# Patient Record
Sex: Male | Born: 1995 | Race: Black or African American | Hispanic: No | Marital: Single | Smoking: Never smoker
Health system: Southern US, Community
[De-identification: ages and names within clinical notes are randomized; demographics above are authoritative.]

## PROBLEM LIST (undated history)

## (undated) DIAGNOSIS — Q600 Renal agenesis, unilateral: Secondary | ICD-10-CM

## (undated) DIAGNOSIS — G43909 Migraine, unspecified, not intractable, without status migrainosus: Secondary | ICD-10-CM

## (undated) HISTORY — PX: EYE SURGERY: SHX253

## (undated) HISTORY — PX: HERNIA REPAIR: SHX51

---
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
## 2016-09-17 ENCOUNTER — Ambulatory Visit (INDEPENDENT_AMBULATORY_CARE_PROVIDER_SITE_OTHER): Payer: BLUE CROSS/BLUE SHIELD | Admitting: Urgent Care

## 2016-09-17 VITALS — BP 118/62 | HR 67 | Temp 98.2°F | Resp 16 | Ht 69.5 in | Wt 149.8 lb

## 2016-09-17 DIAGNOSIS — Z23 Encounter for immunization: Secondary | ICD-10-CM | POA: Diagnosis not present

## 2016-09-17 DIAGNOSIS — N6342 Unspecified lump in left breast, subareolar: Secondary | ICD-10-CM

## 2016-09-17 DIAGNOSIS — J4520 Mild intermittent asthma, uncomplicated: Secondary | ICD-10-CM

## 2016-09-17 MED ORDER — ALBUTEROL SULFATE HFA 108 (90 BASE) MCG/ACT IN AERS: 2.0000 | INHALATION_SPRAY | Freq: Four times a day (QID) | RESPIRATORY_TRACT | 5 refills | Status: AC | PRN

## 2016-09-17 MED ORDER — ALBUTEROL SULFATE (2.5 MG/3ML) 0.083% IN NEBU: 2.5000 mg | INHALATION_SOLUTION | RESPIRATORY_TRACT | 5 refills | Status: AC | PRN

## 2016-09-17 NOTE — Patient Instructions (Addendum)
  950am 09/20/16 [address] [place] [facility]   IF you received an x-ray today, you will receive an Economistinvoice from [facility]. Please contact [facility] at [phone] with questions or concerns regarding your invoice.   IF you received labwork today, you will receive an invoice from [facility]. Please contact [facility] at [phone] with questions or concerns regarding your invoice.   Our billing staff will not be able to assist you with questions regarding bills from these companies.  You will be contacted with the lab results as soon as they are available. The fastest way to get your results is to activate your My Chart account. Instructions are located on the last page of this paperwork. If you have not heard from us regarding the results in 2 weeks, please contact this office.

## 2016-09-17 NOTE — Progress Notes (Addendum)
  MRN: [id number] DOB: 09-26-1995  Subjective:   Douglas Dickson is a 21 y.o. male presenting for chief complaint of Cyst (LEFT SIDE OF LEFT BREAST x 2 days) and Medication Refill (albuterol inhaler and nebulizer solutions)  Cyst - Reports 2 day history of left sided masses near his nipple. Mass is tender with pressure. Has not tried any interventions. Denies fever, redness, drainage of pus or bleeding. Maternal grandmother has history of breast cancer twice.  Asthma - Managed with asthma inhaler and nebulizer. Weather changes flare up his asthma. Reports that he has had to use his albuterol inhaler 1-2 times every other week. Has used his nebulizer occasionally in the past 2 months. He would like a refill since the Spring season is about to start and typically flares up his asthma. Denies smoking cigarettes. Denies fever, weight loss, decreased appetite, rashes, n/v, abdominal pain, chest pain.   Jomarie LongsJoseph has a current medication list which includes the following prescription(s): acetaminophen, albuterol, albuterol, and fluticasone. Also is allergic to codeine.  Jomarie LongsJoseph  has a past medical history of Asthma; Congenital single kidney; and Migraines. Also  has a past surgical history that includes Hernia repair and Eye surgery. His family history includes Cancer in his maternal grandmother; Diabetes in his maternal grandmother and paternal grandfather; Heart disease in his father; Hypertension in his maternal grandmother.    Objective:   Vitals: BP 118/62 (BP Location: Right Arm, Patient Position: Sitting, Cuff Size: Normal)   Pulse 67   Temp 98.2 F (36.8 C) (Oral)   Resp 16   Ht 5' 9.5" (1.765 m)   Wt 149 lb 12.8 oz (67.9 kg)   SpO2 99%   BMI 21.80 kg/m   Physical Exam  Constitutional: He is oriented to person, place, and time. He appears well-developed and well-nourished.  HENT:  Mouth/Throat: Oropharynx is clear and moist.  Eyes: EOM are normal. Pupils are equal, round, and reactive  to light. No scleral icterus.  Neck: Normal range of motion. Neck supple. No thyromegaly present.  Cardiovascular: Normal rate, regular rhythm and intact distal pulses.  Exam reveals no gallop and no friction rub.   No murmur heard. Pulmonary/Chest: No respiratory distress. He has no wheezes. He has no rales.    Abdominal: Soft. Bowel sounds are normal. He exhibits no distension and no mass. There is no tenderness. There is no guarding.  Musculoskeletal: He exhibits no edema.  Lymphadenopathy:    He has no cervical adenopathy.  Neurological: He is alert and oriented to person, place, and time.  Skin: Skin is warm and dry. No rash noted.   Assessment and Plan :   1. Subareolar mass of left breast - Will obtain U/S for further evaluation.  2. Mild intermittent asthma without complication - Refilled albuterol inhaler and nebulizer.  3. Need for prophylactic vaccination and inoculation against influenza - Flu Vaccine QUAD 36+ mos IM   Wallis BambergMario Lavontay Kirk, PA-C [facility] [phone] 09/17/2016  3:28 PM

## 2016-09-18 LAB — CBC WITH DIFFERENTIAL/PLATELET
BASOS: 0 %
Basophils Absolute: 0 10*3/uL (ref 0.0–0.2)
EOS (ABSOLUTE): 0.1 10*3/uL (ref 0.0–0.4)
EOS: 1 %
HEMATOCRIT: 45 % (ref 37.5–51.0)
Hemoglobin: 15.2 g/dL (ref 13.0–17.7)
IMMATURE GRANS (ABS): 0 10*3/uL (ref 0.0–0.1)
IMMATURE GRANULOCYTES: 0 %
LYMPHS: 50 %
Lymphocytes Absolute: 2.6 10*3/uL (ref 0.7–3.1)
MCH: 29.6 pg (ref 26.6–33.0)
MCHC: 33.8 g/dL (ref 31.5–35.7)
MCV: 88 fL (ref 79–97)
Monocytes Absolute: 0.4 10*3/uL (ref 0.1–0.9)
Monocytes: 8 %
NEUTROS ABS: 2.2 10*3/uL (ref 1.4–7.0)
NEUTROS PCT: 41 %
Platelets: 244 10*3/uL (ref 150–379)
RBC: 5.13 x10E6/uL (ref 4.14–5.80)
RDW: 14.1 % (ref 12.3–15.4)
WBC: 5.2 10*3/uL (ref 3.4–10.8)

## 2016-09-20 ENCOUNTER — Ambulatory Visit
Admission: RE | Admit: 2016-09-20 | Discharge: 2016-09-20 | Disposition: A | Discharge status: Home / Self Care | Payer: BLUE CROSS/BLUE SHIELD | Source: Ambulatory Visit | Attending: Urgent Care | Admitting: Urgent Care

## 2017-09-19 ENCOUNTER — Emergency Department (HOSPITAL_BASED_OUTPATIENT_CLINIC_OR_DEPARTMENT_OTHER)
Admission: EM | Admit: 2017-09-19 | Discharge: 2017-09-19 | Disposition: A | Discharge status: Home / Self Care | Payer: BLUE CROSS/BLUE SHIELD | Attending: Physician Assistant | Admitting: Physician Assistant

## 2017-09-19 ENCOUNTER — Encounter (HOSPITAL_BASED_OUTPATIENT_CLINIC_OR_DEPARTMENT_OTHER): Payer: Self-pay | Admitting: *Deleted

## 2017-09-19 ENCOUNTER — Emergency Department (HOSPITAL_BASED_OUTPATIENT_CLINIC_OR_DEPARTMENT_OTHER): Payer: BLUE CROSS/BLUE SHIELD

## 2017-09-19 ENCOUNTER — Other Ambulatory Visit: Payer: Self-pay

## 2017-09-19 DIAGNOSIS — J111 Influenza due to unidentified influenza virus with other respiratory manifestations: Secondary | ICD-10-CM | POA: Insufficient documentation

## 2017-09-19 DIAGNOSIS — H6123 Impacted cerumen, bilateral: Secondary | ICD-10-CM | POA: Diagnosis not present

## 2017-09-19 DIAGNOSIS — R509 Fever, unspecified: Secondary | ICD-10-CM | POA: Diagnosis present

## 2017-09-19 DIAGNOSIS — J45909 Unspecified asthma, uncomplicated: Secondary | ICD-10-CM | POA: Diagnosis not present

## 2017-09-19 DIAGNOSIS — R69 Illness, unspecified: Secondary | ICD-10-CM

## 2017-09-19 MED ORDER — BENZONATATE 100 MG PO CAPS: 100.0000 mg | ORAL_CAPSULE | Freq: Three times a day (TID) | ORAL | 0 refills | Status: DC | PRN

## 2017-09-19 MED ORDER — ACETAMINOPHEN 325 MG PO TABS
650.0000 mg | ORAL_TABLET | Freq: Once | ORAL | Status: AC
  Administered 2017-09-19: 650 mg via ORAL
  Filled 2017-09-19: qty 2

## 2017-09-19 MED FILL — BENZONATATE 100 MG CAPSULE: 100 | 7 days supply | Qty: 21 | Fill #0

## 2017-09-19 NOTE — Discharge Instructions (Signed)
Please take Tylenol (acetaminophen) to relieve your pain.  You may take tylenol, up to 1,000 mg (two extra strength pills).  Do not take more than 4,000 mg tylenol in a 24 hour period.  Please check all medication labels as many medications such as pain and cold medications may contain tylenol. Please do not drink alcohol while taking this medication.   Please make sure you are staying well hydrated.

## 2017-09-19 NOTE — ED Triage Notes (Signed)
Cough, headache and chills x 2 days.

## 2017-09-19 NOTE — ED Provider Notes (Signed)
[facility] EMERGENCY DEPARTMENT Provider Note   CSN: [id number] Arrival date & time: 09/19/17  1138     History   Chief Complaint Chief Complaint  Patient presents with  . Cough    HPI Douglas Dickson is a 22 y.o. male with a history of asthma, congenital single kidney, migraines, who presents today for evaluation of fevers, chills, generally not feeling well, and cough since Saturday.  He reports that at home he has been taking Benadryl and Mucinex without significant relief.  No nausea vomiting diarrhea.  He does report occasional headache.  He did not get a flu shot this year.  He reports no known sick contacts.    HPI  Past Medical History:  Diagnosis Date  . Asthma   . Congenital single kidney   . Migraines     There are no active problems to display for this patient.   Past Surgical History:  Procedure Laterality Date  . EYE SURGERY    . HERNIA REPAIR         Home Medications    Prior to Admission medications   Medication Sig Start Date End Date Taking? Authorizing Provider  acetaminophen (TYLENOL) 500 MG tablet Take 1,000 mg by mouth every 6 (six) hours as needed for moderate pain or fever.   Yes [provider]  albuterol (PROVENTIL HFA;VENTOLIN HFA) 108 (90 Base) MCG/ACT inhaler Inhale 2 puffs into the lungs every 6 (six) hours as needed for shortness of breath. For shortness of breath 09/17/16  Yes Wallis Bamberg, PA-C  albuterol (PROVENTIL) (2.5 MG/3ML) 0.083% nebulizer solution Take 3 mLs (2.5 mg total) by nebulization every 4 (four) hours as needed (for asthma). For asthma 09/17/16  Yes Wallis Bamberg, PA-C  fluticasone [facility]) 50 MCG/ACT nasal spray Place 2 sprays into the nose daily as needed for allergies or rhinitis.    Yes [provider]  benzonatate (TESSALON) 100 MG capsule Take 1 capsule (100 mg total) by mouth every 8 (eight) hours as needed for cough. 09/19/17   Cristina Gong, PA-C    Family History Family  History  Problem Relation Age of Onset  . Heart disease Father        HEART ATTACK  . Cancer Maternal Grandmother   . Diabetes Maternal Grandmother   . Hypertension Maternal Grandmother   . Diabetes Paternal Grandfather     Social History Social History   Tobacco Use  . Smoking status: Never Smoker  . Smokeless tobacco: Never Used  Substance Use Topics  . Alcohol use: No  . Drug use: No     Allergies   Codeine   Review of Systems Review of Systems  Constitutional: Positive for chills and fever (Subjective).  HENT: Positive for congestion and sore throat. Negative for postnasal drip.   Eyes: Negative for visual disturbance.  Respiratory: Positive for cough. Negative for chest tightness and shortness of breath.   Cardiovascular: Negative for chest pain.  Gastrointestinal: Negative for abdominal pain, diarrhea, nausea and vomiting.  Musculoskeletal: Positive for arthralgias and myalgias.  Neurological: Positive for headaches. Negative for dizziness.  All other systems reviewed and are negative.    Physical Exam Updated Vital Signs BP 134/66   Pulse 96   Temp (!) 100.4 F (38 C) (Oral)   Resp 20   Ht 5\' 11"  (1.803 m)   Wt 70.3 kg (155 lb)   SpO2 100%   BMI 21.62 kg/m   Physical Exam  Constitutional: He appears well-developed and well-nourished.  No distress.  HENT:  Head: Normocephalic and atraumatic.  Right Ear: Tympanic membrane, external ear and ear canal normal.  Left Ear: Tympanic membrane, external ear and ear canal normal.  Nose: Mucosal edema and rhinorrhea present.  Mouth/Throat: Uvula is midline, oropharynx is clear and moist and mucous membranes are normal. No oropharyngeal exudate. No tonsillar exudate.  After cerumen removal bilateral TMs are normal.  Eyes: Conjunctivae are normal. No scleral icterus.  Neck: Normal range of motion. Neck supple.  Cardiovascular: Normal rate and regular rhythm.  Pulmonary/Chest: Effort normal and breath sounds  normal. No stridor. No respiratory distress. He has no wheezes.  Lymphadenopathy:    He has no cervical adenopathy.  Neurological: He is alert.  Skin: Skin is warm and dry. He is not diaphoretic.  Psychiatric: He has a normal mood and affect. His behavior is normal.  Nursing note and vitals reviewed.    ED Treatments / Results  Labs (all labs ordered are listed, but only abnormal results are displayed) Labs Reviewed - No data to display  EKG  EKG Interpretation None       Radiology Dg Chest 2 View  Result Date: 09/19/2017 CLINICAL DATA:  Cough, sneezing spells off and on for several weeks more constant over past week, history asthma EXAM: CHEST  2 VIEW COMPARISON:  10/17/2013 FINDINGS: Normal heart size, mediastinal contours, and pulmonary vascularity. Lungs clear. No pleural effusion or pneumothorax. Bones unremarkable. IMPRESSION: Normal exam. Electronically Signed   By: Ulyses SouthwardMark  Boles M.D.   On: 09/19/2017 13:22    Procedures .Ear Cerumen Removal Date/Time: 09/19/2017 6:02 PM Performed by: Cristina GongHammond, Hedaya Latendresse W, PA-C Authorized by: Cristina GongHammond, Anila Bojarski W, PA-C   Consent:    Consent obtained:  Verbal   Consent given by:  Patient   Risks discussed:  Bleeding, dizziness, infection, incomplete removal, pain and TM perforation (Loss of hearing, scratches)   Alternatives discussed:  No treatment and alternative treatment Procedure details:    Location:  L ear and R ear   Procedure type: curette   Post-procedure details:    Inspection:  TM intact   Hearing quality:  Improved   Patient tolerance of procedure:  Tolerated well, no immediate complications   (including critical care time)  Medications Ordered in ED Medications  acetaminophen (TYLENOL) tablet 650 mg (650 mg Oral Given 09/19/17 1308)     Initial Impression / Assessment and Plan / ED Course  I have reviewed the triage vital signs and the nursing notes.  Pertinent labs & imaging results that were available during  my care of the patient were reviewed by me and considered in my medical decision making (see chart for details).    Patient with symptoms consistent with influenza.  Vitals are stable, low-grade fever.  No signs of dehydration, tolerating PO's.  Lungs are clear.  Based on patient's history of asthma, he requested chest x-ray which was obtained, unremarkable.  Discussed the cost versus benefit of Tamiflu treatment with the patient.  The patient understands that symptoms are greater than the recommended 24-48 hour window of treatment.  Patient will be discharged with instructions to orally hydrate, rest, and use over-the-counter medications such as anti-inflammatories ibuprofen and Aleve for muscle aches and Tylenol for fever.  Patient will also be given a cough suppressant.    Final Clinical Impressions(s) / ED Diagnoses   Final diagnoses:  Influenza-like illness    ED Discharge Orders        Ordered    benzonatate (TESSALON) 100 MG  capsule  Every 8 hours PRN     09/19/17 1351       Norman Clay 09/19/17 1803    Abelino Derrick, MD 09/23/17 2006

## 2018-03-25 ENCOUNTER — Other Ambulatory Visit: Payer: Self-pay

## 2018-03-25 ENCOUNTER — Encounter (HOSPITAL_COMMUNITY): Payer: Self-pay | Admitting: Emergency Medicine

## 2018-03-25 ENCOUNTER — Emergency Department (HOSPITAL_COMMUNITY)
Admission: EM | Admit: 2018-03-25 | Discharge: 2018-03-25 | Disposition: A | Discharge status: Home / Self Care | Payer: BLUE CROSS/BLUE SHIELD | Attending: Emergency Medicine | Admitting: Emergency Medicine

## 2018-03-25 DIAGNOSIS — G43909 Migraine, unspecified, not intractable, without status migrainosus: Secondary | ICD-10-CM | POA: Insufficient documentation

## 2018-03-25 DIAGNOSIS — J45909 Unspecified asthma, uncomplicated: Secondary | ICD-10-CM | POA: Insufficient documentation

## 2018-03-25 DIAGNOSIS — Z79899 Other long term (current) drug therapy: Secondary | ICD-10-CM | POA: Insufficient documentation

## 2018-03-25 DIAGNOSIS — G43109 Migraine with aura, not intractable, without status migrainosus: Secondary | ICD-10-CM

## 2018-03-25 MED ORDER — DEXAMETHASONE 4 MG PO TABS
10.0000 mg | ORAL_TABLET | Freq: Once | ORAL | Status: AC
  Administered 2018-03-25: 10 mg via ORAL
  Filled 2018-03-25: qty 3

## 2018-03-25 MED ORDER — PROCHLORPERAZINE MALEATE 5 MG PO TABS
10.0000 mg | ORAL_TABLET | Freq: Once | ORAL | Status: AC
  Administered 2018-03-25: 10 mg via ORAL
  Filled 2018-03-25: qty 2

## 2018-03-25 MED ORDER — DIPHENHYDRAMINE HCL 25 MG PO CAPS
25.0000 mg | ORAL_CAPSULE | Freq: Once | ORAL | Status: AC
  Administered 2018-03-25: 25 mg via ORAL
  Filled 2018-03-25: qty 1

## 2018-03-25 NOTE — ED Provider Notes (Signed)
[facility] EMERGENCY DEPARTMENT Provider Note   CSN: [id number] Arrival date & time: 03/25/18  1630     History   Chief Complaint Chief Complaint  Patient presents with  . Migraine    HPI Douglas Dickson is a 22 y.o. male.  The history is provided by the patient.  Migraine  This is a chronic problem. The current episode started 1 to 2 hours ago. The problem occurs every several days. The problem has been gradually improving. Associated symptoms include headaches. Pertinent negatives include no chest pain, no abdominal pain and no shortness of breath. Exacerbated by: light. The symptoms are relieved by acetaminophen. He has tried acetaminophen for the symptoms. The treatment provided moderate relief.    Past Medical History:  Diagnosis Date  . Asthma   . Congenital single kidney   . Migraines     There are no active problems to display for this patient.   Past Surgical History:  Procedure Laterality Date  . EYE SURGERY    . HERNIA REPAIR          Home Medications    Prior to Admission medications   Medication Sig Start Date End Date Taking? Authorizing Provider  acetaminophen (TYLENOL) 500 MG tablet Take 1,000 mg by mouth every 6 (six) hours as needed for moderate pain or fever.    [provider]  albuterol (PROVENTIL HFA;VENTOLIN HFA) 108 (90 Base) MCG/ACT inhaler Inhale 2 puffs into the lungs every 6 (six) hours as needed for shortness of breath. For shortness of breath 09/17/16   Wallis Bamberg, PA-C  albuterol (PROVENTIL) (2.5 MG/3ML) 0.083% nebulizer solution Take 3 mLs (2.5 mg total) by nebulization every 4 (four) hours as needed (for asthma). For asthma 09/17/16   Wallis Bamberg, PA-C  benzonatate (TESSALON) 100 MG capsule Take 1 capsule (100 mg total) by mouth every 8 (eight) hours as needed for cough. 09/19/17   Cristina Gong, PA-C  fluticasone [facility]) 50 MCG/ACT nasal spray Place 2 sprays into the nose daily as needed for  allergies or rhinitis.     [provider]    Family History Family History  Problem Relation Age of Onset  . Heart disease Father        HEART ATTACK  . Cancer Maternal Grandmother   . Diabetes Maternal Grandmother   . Hypertension Maternal Grandmother   . Diabetes Paternal Grandfather     Social History Social History   Tobacco Use  . Smoking status: Never Smoker  . Smokeless tobacco: Never Used  Substance Use Topics  . Alcohol use: No  . Drug use: No     Allergies   Codeine   Review of Systems Review of Systems  Constitutional: Negative for chills and fever.  HENT: Negative for ear pain and sore throat.   Eyes: Positive for photophobia and visual disturbance. Negative for pain.  Respiratory: Negative for cough and shortness of breath.   Cardiovascular: Negative for chest pain and palpitations.  Gastrointestinal: Negative for abdominal pain and vomiting.  Genitourinary: Negative for dysuria and hematuria.  Musculoskeletal: Negative for arthralgias and back pain.  Skin: Negative for color change and rash.  Neurological: Positive for headaches. Negative for dizziness, tremors, seizures, syncope and weakness.  All other systems reviewed and are negative.    Physical Exam Updated Vital Signs  ED Triage Vitals  Enc Vitals Group     BP 03/25/18 1646 (!) 147/89     Pulse Rate 03/25/18 1646 70  Resp 03/25/18 1646 18     Temp 03/25/18 1646 98.2 F (36.8 C)     Temp Source 03/25/18 1646 Oral     SpO2 03/25/18 1646 100 %     Weight --      Height --      Head Circumference --      Peak Flow --      Pain Score 03/25/18 1651 2     Pain Loc --      Pain Edu? --      Excl. in GC? --     Physical Exam  Constitutional: He is oriented to person, place, and time. He appears well-developed and well-nourished.  HENT:  Head: Normocephalic and atraumatic.  Eyes: Pupils are equal, round, and reactive to light. Conjunctivae and EOM are normal.  Neck:  Normal range of motion. Neck supple.  Cardiovascular: Normal rate and regular rhythm.  No murmur heard. Pulmonary/Chest: Effort normal and breath sounds normal. No respiratory distress.  Abdominal: Soft. There is no tenderness.  Musculoskeletal: Normal range of motion. He exhibits no edema.  Neurological: He is alert and oriented to person, place, and time. No cranial nerve deficit. He exhibits normal muscle tone. Coordination normal.  5+/5 strength, normal sensation, normal finger to nose finger, no drift, normal gait  Skin: Skin is warm and dry.  Psychiatric: He has a normal mood and affect.  Nursing note and vitals reviewed.    ED Treatments / Results  Labs (all labs ordered are listed, but only abnormal results are displayed) Labs Reviewed - No data to display  EKG None  Radiology No results found.  Procedures Procedures (including critical care time)  Medications Ordered in ED Medications  dexamethasone (DECADRON) tablet 10 mg (has no administration in time range)  diphenhydrAMINE (BENADRYL) capsule 25 mg (has no administration in time range)  prochlorperazine (COMPAZINE) tablet 10 mg (has no administration in time range)     Initial Impression / Assessment and Plan / ED Course  I have reviewed the triage vital signs and the nursing notes.  Pertinent labs & imaging results that were available during my care of the patient were reviewed by me and considered in my medical decision making (see chart for details).     Douglas Dickson is a 22 year old male with history of migraines who presents to the ED with headaches.  Patient with normal vitals.  No fever.  Patient has had headaches on and off for the last several weeks.  Has a history of headache since he was a child.  Family history of headaches as well.  Patient states that he gets the aura of paresthesias in his hands, photosensitivity and states that headaches usually improved with Motrin and Tylenol.  Headaches  seem to have increased in frequency over the last several weeks.  He has never seen a neurologist for his headaches.  Patient with no neurological symptoms at this time.  States his headache is about a 2 out of 10.  Patient has no signs to suggest infectious process at this time such as meningitis.  Patient is overall well-appearing.  Has a normal neurological exam.  History and physical is consistent with migraine headaches.  He was given Benadryl, Compazine, Decadron p.o. for further headache relief.  Recommend close follow-up with primary care doctor and believe patient would benefit from neurologist work-up as he would likely be a candidate for preventative headache medication given his history.  Patient was discharged from ED in good condition and  told to return to ED if symptoms worsen.  Final Clinical Impressions(s) / ED Diagnoses   Final diagnoses:  Migraine with aura and without status migrainosus, not intractable    ED Discharge Orders    None       Virgina NorfolkCuratolo, Mardi Cannady, DO 03/25/18 1942

## 2018-03-25 NOTE — ED Triage Notes (Signed)
Pt c/o migraines x 1 mos. Hx of migraines, states they have been coming more frequently and severity. Pt c/o severe headaches, nausea, left hand numbness and photosensitivity. States symptoms have mostly resolved at this time. r

## 2023-09-09 ENCOUNTER — Encounter (HOSPITAL_BASED_OUTPATIENT_CLINIC_OR_DEPARTMENT_OTHER): Payer: Self-pay | Admitting: Emergency Medicine

## 2023-09-09 ENCOUNTER — Emergency Department (HOSPITAL_BASED_OUTPATIENT_CLINIC_OR_DEPARTMENT_OTHER)
Admission: EM | Admit: 2023-09-09 | Discharge: 2023-09-09 | Disposition: A | Discharge status: Home / Self Care | Payer: 59

## 2023-09-09 ENCOUNTER — Other Ambulatory Visit: Payer: Self-pay

## 2023-09-09 DIAGNOSIS — M79674 Pain in right toe(s): Secondary | ICD-10-CM | POA: Diagnosis present

## 2023-09-09 DIAGNOSIS — L03031 Cellulitis of right toe: Secondary | ICD-10-CM | POA: Insufficient documentation

## 2023-09-09 DIAGNOSIS — L03032 Cellulitis of left toe: Secondary | ICD-10-CM

## 2023-09-09 MED ORDER — AMOXICILLIN-POT CLAVULANATE 875-125 MG PO TABS: 1.0000 | ORAL_TABLET | Freq: Two times a day (BID) | ORAL | 0 refills | Status: AC

## 2023-09-09 NOTE — ED Provider Notes (Signed)
[place] EMERGENCY DEPARTMENT AT [facility] Provider Note   CSN: [id number] Arrival date & time: 09/09/23  8416     History  Chief Complaint  Patient presents with   Toe Pain    Douglas Dickson is a 28 y.o. male.  28 year old with right toe pain x 1-1/2 days.  Symptoms started after trimming nails.  Painful.  No fevers chills.  Not draining pus.   Toe Pain       Home Medications Prior to Admission medications   Medication Sig Start Date End Date Taking? Authorizing Provider  acetaminophen (TYLENOL) 500 MG tablet Take 1,000 mg by mouth every 6 (six) hours as needed for moderate pain or fever.    [provider]  albuterol (PROVENTIL HFA;VENTOLIN HFA) 108 (90 Base) MCG/ACT inhaler Inhale 2 puffs into the lungs every 6 (six) hours as needed for shortness of breath. For shortness of breath 09/17/16   Wallis Bamberg, PA-C  albuterol (PROVENTIL) (2.5 MG/3ML) 0.083% nebulizer solution Take 3 mLs (2.5 mg total) by nebulization every 4 (four) hours as needed (for asthma). For asthma 09/17/16   Wallis Bamberg, PA-C  benzonatate (TESSALON) 100 MG capsule Take 1 capsule (100 mg total) by mouth every 8 (eight) hours as needed for cough. 09/19/17   Cristina Gong, PA-C  fluticasone [facility]) 50 MCG/ACT nasal spray Place 2 sprays into the nose daily as needed for allergies or rhinitis.     [provider]      Allergies    Codeine    Review of Systems   Review of Systems  Physical Exam Updated Vital Signs BP (!) 143/99 (BP Location: Left Arm)   Pulse 72   Temp 98.7 F (37.1 C) (Tympanic)   Resp 16   Ht 5\' 11"  (1.803 m)   Wt 71.2 kg   SpO2 100%   BMI 21.90 kg/m  Physical Exam Vitals reviewed.  HENT:     Head: Normocephalic.  Cardiovascular:     Rate and Rhythm: Normal rate.     Pulses: Normal pulses.  Pulmonary:     Effort: Pulmonary effort is normal.  Abdominal:     General: Abdomen is flat.  Musculoskeletal:     Comments: Great toe  with paronychia.  Skin:    General: Skin is warm.     Capillary Refill: Capillary refill takes less than 2 seconds.  Neurological:     Mental Status: He is alert and oriented to person, place, and time.  Psychiatric:        Mood and Affect: Mood normal.        Behavior: Behavior normal.     ED Results / Procedures / Treatments   Labs (all labs ordered are listed, but only abnormal results are displayed) Labs Reviewed - No data to display  EKG None  Radiology No results found.  Procedures Procedures    Medications Ordered in ED Medications - No data to display  ED Course/ Medical Decision Making/ A&P                                 Medical Decision Making 28 year old male present emergency department with toe pain.  Is afebrile vital signs reassuring.  Exam with paronychia.  Does not appear to have fluctuance to necessitate incision and drainage.  Does have some superficial cellulitis extending to the first joint.  Will discharge with some antibiotics.  Also instructed to soak  in iodine water.  Follow-up with PCP.  Amount and/or Complexity of Data Reviewed Labs:     Details: Isolated localized infection, low suspicion for systemic infection.  Will forego labs as I do not believe patient with systemic illness/sepsis. Radiology:     Details: Considered x-ray, however exam consistent with paronychia with superficial cellulitis.  Low suspicion for osteomyelitis or deep space infection.  Risk Decision regarding hospitalization. Risk Details: Reassuring vitals and exam.  Low suspicion for systemic infection.          Final Clinical Impression(s) / ED Diagnoses Final diagnoses:  None    Rx / DC Orders ED Discharge Orders     None         Coral Spikes, DO 09/09/23 [id number]

## 2023-09-09 NOTE — ED Notes (Signed)
Reviewed discharge instructions, follow up and medications with pt. Pt states understanding. Pt given bottle of iodine to take home. Pain free at this time

## 2023-09-09 NOTE — Discharge Instructions (Addendum)
Please soak your foot in warm water with 3 to 4 drops of iodine for 15 to 20 minutes 2-3 times a day.  Also please take antibiotics prescribed to you.  Please follow-up with your primary doctor.  Return if develop fevers, chills, redness spreads, toe becomes black, blue or discolored or he develop any new or worsening symptoms that are concerning to you.

## 2023-09-09 NOTE — ED Triage Notes (Signed)
Pt states cut toe nail to short got red and swollen, now is yellowish. 1st toe on right foot about 2 days ago.

## 2023-11-07 ENCOUNTER — Other Ambulatory Visit: Payer: Self-pay

## 2023-11-07 ENCOUNTER — Emergency Department (HOSPITAL_BASED_OUTPATIENT_CLINIC_OR_DEPARTMENT_OTHER)
Admission: EM | Admit: 2023-11-07 | Discharge: 2023-11-07 | Disposition: A | Discharge status: Home / Self Care | Attending: Emergency Medicine | Admitting: Emergency Medicine

## 2023-11-07 DIAGNOSIS — M791 Myalgia, unspecified site: Secondary | ICD-10-CM | POA: Insufficient documentation

## 2023-11-07 DIAGNOSIS — J069 Acute upper respiratory infection, unspecified: Secondary | ICD-10-CM | POA: Insufficient documentation

## 2023-11-07 DIAGNOSIS — R112 Nausea with vomiting, unspecified: Secondary | ICD-10-CM | POA: Diagnosis not present

## 2023-11-07 DIAGNOSIS — R197 Diarrhea, unspecified: Secondary | ICD-10-CM | POA: Insufficient documentation

## 2023-11-07 DIAGNOSIS — R059 Cough, unspecified: Secondary | ICD-10-CM | POA: Diagnosis present

## 2023-11-07 LAB — RESP PANEL BY RT-PCR (RSV, FLU A&B, COVID)  RVPGX2
Influenza A by PCR: NEGATIVE
Influenza B by PCR: NEGATIVE
Resp Syncytial Virus by PCR: NEGATIVE
SARS Coronavirus 2 by RT PCR: NEGATIVE

## 2023-11-07 MED ORDER — ONDANSETRON 4 MG PO TBDP: ORAL_TABLET | ORAL | 0 refills | Status: AC

## 2023-11-07 MED ORDER — BENZONATATE 100 MG PO CAPS: 100.0000 mg | ORAL_CAPSULE | Freq: Three times a day (TID) | ORAL | 0 refills | Status: AC

## 2023-11-07 NOTE — Discharge Instructions (Signed)
 Take tylenol 2 pills 4 times a day and motrin 4 pills 3 times a day.  Drink plenty of fluids.  Return for worsening shortness of breath, headache, confusion. Follow up with your family doctor.

## 2023-11-07 NOTE — ED Provider Notes (Signed)
 [place] EMERGENCY DEPARTMENT AT [facility] Provider Note   CSN: [id number] Arrival date & time: 11/07/23  7829     History  Chief Complaint  Patient presents with   URI    Douglas Dickson is a 28 y.o. male.  28 yo M with a chief complaints of cough congestion fevers chills myalgias nausea vomiting and diarrhea.  Been going on for about a week.  Started with just cough and congestion he thought maybe he had had worsening allergies.  Symptoms got worse and then actually have gotten a bit better over the past day or so.  He is most concerned with the fact that he was having fevers and chills off and on.  Came in for evaluation.  Does have a history of asthma but does not feel that its much worse than baseline.   URI      Home Medications Prior to Admission medications   Medication Sig Start Date End Date Taking? Authorizing Provider  benzonatate (TESSALON) 100 MG capsule Take 1 capsule (100 mg total) by mouth every 8 (eight) hours. 11/07/23  Yes Melene Plan, DO  ondansetron (ZOFRAN-ODT) 4 MG disintegrating tablet 4mg  ODT q4 hours prn nausea/vomit 11/07/23  Yes Melene Plan, DO  acetaminophen (TYLENOL) 500 MG tablet Take 1,000 mg by mouth every 6 (six) hours as needed for moderate pain or fever.    [provider]  albuterol (PROVENTIL HFA;VENTOLIN HFA) 108 (90 Base) MCG/ACT inhaler Inhale 2 puffs into the lungs every 6 (six) hours as needed for shortness of breath. For shortness of breath 09/17/16   Wallis Bamberg, PA-C  albuterol (PROVENTIL) (2.5 MG/3ML) 0.083% nebulizer solution Take 3 mLs (2.5 mg total) by nebulization every 4 (four) hours as needed (for asthma). For asthma 09/17/16   Wallis Bamberg, PA-C  fluticasone [facility]) 50 MCG/ACT nasal spray Place 2 sprays into the nose daily as needed for allergies or rhinitis.     [provider]      Allergies    Codeine    Review of Systems   Review of Systems  Physical Exam Updated Vital Signs BP (!)  147/88 (BP Location: Right Arm)   Pulse 66   Temp 97.8 F (36.6 C) (Oral)   Resp 14   SpO2 99%  Physical Exam Vitals and nursing note reviewed.  Constitutional:      Appearance: He is well-developed.  HENT:     Head: Normocephalic and atraumatic.     Comments: Swollen turbinates, posterior nasal drip, no noted sinus ttp, right TM normal.  Left TM obscured by wax  Eyes:     Pupils: Pupils are equal, round, and reactive to light.  Neck:     Vascular: No JVD.  Cardiovascular:     Rate and Rhythm: Normal rate and regular rhythm.     Heart sounds: No murmur heard.    No friction rub. No gallop.  Pulmonary:     Effort: No respiratory distress.     Breath sounds: No wheezing.  Abdominal:     General: There is no distension.     Tenderness: There is no abdominal tenderness. There is no guarding or rebound.  Musculoskeletal:        General: Normal range of motion.     Cervical back: Normal range of motion and neck supple.  Skin:    Coloration: Skin is not pale.     Findings: No rash.  Neurological:     Mental Status: He is alert and  oriented to person, place, and time.  Psychiatric:        Behavior: Behavior normal.     ED Results / Procedures / Treatments   Labs (all labs ordered are listed, but only abnormal results are displayed) Labs Reviewed - No data to display  EKG None  Radiology No results found.  Procedures Procedures    Medications Ordered in ED Medications - No data to display  ED Course/ Medical Decision Making/ A&P                                 Medical Decision Making Risk Prescription drug management.   28 yo M with a chief complaints of cough congestion fever chills myalgias nausea vomiting and diarrhea.  Going on for about a week now.  Actually getting better.  Is well-appearing nontoxic.  Appears well-hydrated.  Clear lung sounds for me.  No hypoxia no tachypnea.  Do not feel he benefit from chest imaging.  No bacterial source was found  otherwise on exam.  Will continue to treat as a viral syndrome.  PCP follow-up.  9:35 AM:  I have discussed the diagnosis/risks/treatment options with the patient and family.  Evaluation and diagnostic testing in the emergency department does not suggest an emergent condition requiring admission or immediate intervention beyond what has been performed at this time.  They will follow up with PCP. We also discussed returning to the ED immediately if new or worsening sx occur. We discussed the sx which are most concerning (e.g., sudden worsening pain, fever, inability to tolerate by mouth) that necessitate immediate return. Medications administered to the patient during their visit and any new prescriptions provided to the patient are listed below.  Medications given during this visit Medications - No data to display   The patient appears reasonably screen and/or stabilized for discharge and I doubt any other medical condition or other [facility] requiring further screening, evaluation, or treatment in the ED at this time prior to discharge.          Final Clinical Impression(s) / ED Diagnoses Final diagnoses:  Viral upper respiratory tract infection    Rx / DC Orders ED Discharge Orders          Ordered    ondansetron (ZOFRAN-ODT) 4 MG disintegrating tablet        11/07/23 0933    benzonatate (TESSALON) 100 MG capsule  Every 8 hours        11/07/23 0933              Shuntia Exton, DO 11/07/23 0935

## 2023-11-07 NOTE — ED Triage Notes (Signed)
 Pt states that last week he began having a cough and increased mucous which he attributed to pollen. Pt states that he then developed other symptoms including N/V/D and chills which have now subsided.

## 2024-04-26 ENCOUNTER — Encounter (HOSPITAL_BASED_OUTPATIENT_CLINIC_OR_DEPARTMENT_OTHER): Payer: Self-pay

## 2024-04-26 ENCOUNTER — Other Ambulatory Visit (HOSPITAL_BASED_OUTPATIENT_CLINIC_OR_DEPARTMENT_OTHER): Payer: Self-pay

## 2024-04-26 ENCOUNTER — Emergency Department (HOSPITAL_BASED_OUTPATIENT_CLINIC_OR_DEPARTMENT_OTHER)
Admission: EM | Admit: 2024-04-26 | Discharge: 2024-04-26 | Disposition: A | Discharge status: Home / Self Care | Attending: Emergency Medicine | Admitting: Emergency Medicine

## 2024-04-26 ENCOUNTER — Other Ambulatory Visit: Payer: Self-pay

## 2024-04-26 DIAGNOSIS — L0201 Cutaneous abscess of face: Secondary | ICD-10-CM | POA: Diagnosis present

## 2024-04-26 MED ORDER — LIDOCAINE-EPINEPHRINE (PF) 2 %-1:200000 IJ SOLN
10.0000 mL | Freq: Once | INTRAMUSCULAR | Status: AC
  Administered 2024-04-26: 10 mL
  Filled 2024-04-26: qty 20

## 2024-04-26 MED ORDER — DOXYCYCLINE HYCLATE 100 MG PO CAPS
100.0000 mg | ORAL_CAPSULE | Freq: Two times a day (BID) | ORAL | 0 refills | Status: AC
  Filled 2024-04-26: qty 14, 7d supply, fill #0

## 2024-04-26 NOTE — ED Triage Notes (Addendum)
 Pt. Reports cyst on face after being nicked at barbershop. Pt. States he's had the facial cyst for 3 days. Pt. Denies pain and fevers at home.

## 2024-04-26 NOTE — Discharge Instructions (Signed)
 You can continue warm compresses 2-3 times a day.  Remove wick in 2 days.  Warm soap and water is okay.  Finish antibiotics.  Follow-up with your regular doctor.  Return if any worsening or concerning symptoms

## 2024-04-26 NOTE — ED Notes (Signed)
 Area to right side of jaw cleansed with wound cleaner. Dressing applied. Went over dressing changes and discharge instructions. Gave some supplies to pt to take home.

## 2024-04-26 NOTE — ED Notes (Signed)
 ED Provider at bedside.

## 2024-04-26 NOTE — ED Provider Notes (Signed)
 [place] [facility] Provider Note   CSN: [id number] Arrival date & time: 04/26/24  9362     Patient presents with: Abscess   Douglas Dickson is a 28 y.o. male.  He is here with complaint of a cyst or a boil on his face.  He said he went to a barbershop and had a shave and a haircut.  Started getting a growth on the right side of his jawline.  Has tried a warm compress without much improvement.  No drainage.  No fever.  He said he has had some ingrown hairs before but they usually are small and pop easily.   The history is provided by the patient.  Abscess Location:  Face Facial abscess location: right jaw. Abscess quality: fluctuance, induration and painful   Red streaking: no   Duration:  1 week Progression:  Unchanged Chronicity:  New Context: skin injury   Relieved by:  Nothing Ineffective treatments:  Warm compresses Associated symptoms: no fever, no nausea and no vomiting   Risk factors: no hx of MRSA        Prior to Admission medications   Medication Sig Start Date End Date Taking? Authorizing Provider  acetaminophen  (TYLENOL ) 500 MG tablet Take 1,000 mg by mouth every 6 (six) hours as needed for moderate pain or fever.    [provider]  albuterol  (PROVENTIL  HFA;VENTOLIN  HFA) 108 (90 Base) MCG/ACT inhaler Inhale 2 puffs into the lungs every 6 (six) hours as needed for shortness of breath. For shortness of breath 09/17/16   Christopher Savannah, PA-C  albuterol  (PROVENTIL ) (2.5 MG/3ML) 0.083% nebulizer solution Take 3 mLs (2.5 mg total) by nebulization every 4 (four) hours as needed (for asthma). For asthma 09/17/16   Christopher Savannah, PA-C  benzonatate  (TESSALON ) 100 MG capsule Take 1 capsule (100 mg total) by mouth every 8 (eight) hours. 11/07/23   Floyd, Dan, DO  fluticasone (FLONASE) 50 MCG/ACT nasal spray Place 2 sprays into the nose daily as needed for allergies or rhinitis.     [provider]  ondansetron  (ZOFRAN -ODT) 4 MG  disintegrating tablet 4mg  ODT q4 hours prn nausea/vomit 11/07/23   Floyd, Dan, DO    Allergies: Codeine    Review of Systems  Constitutional:  Negative for fever.  Gastrointestinal:  Negative for nausea and vomiting.    Updated Vital Signs BP (!) 153/83   Pulse 70   Temp 97.8 F (36.6 C) (Oral)   Resp 20   SpO2 100%   Physical Exam Vitals and nursing note reviewed.  Constitutional:      Appearance: Normal appearance. He is well-developed.  HENT:     Head: Normocephalic and atraumatic.     Comments: Approximately 3 cm raised indurated fluctuant area at angle of right ramus.  Few small pustules, nothing draining.  Minimal overlying erythema.  No cervical adenopathy    Nose: Nose normal.     Mouth/Throat:     Mouth: Mucous membranes are moist.     Pharynx: Oropharynx is clear.  Eyes:     Conjunctiva/sclera: Conjunctivae normal.  Pulmonary:     Effort: Pulmonary effort is normal.  Musculoskeletal:     Cervical back: Neck supple.  Lymphadenopathy:     Cervical: No cervical adenopathy.  Skin:    General: Skin is warm and dry.  Neurological:     General: No focal deficit present.     Mental Status: He is alert.     GCS: GCS eye subscore  is 4. GCS verbal subscore is 5. GCS motor subscore is 6.     (all labs ordered are listed, but only abnormal results are displayed) Labs Reviewed - No data to display  EKG: None  Radiology: No results found.   .Incision and Drainage  Date/Time: 04/26/2024 7:35 AM  Performed by: Towana Ozell BROCKS, MD Authorized by: Towana Ozell BROCKS, MD   Consent:    Consent obtained:  Verbal   Consent given by:  Patient   Risks discussed:  Bleeding, incomplete drainage, pain and infection   Alternatives discussed:  No treatment, delayed treatment and referral Universal protocol:    Procedure explained and questions answered to patient or proxy's satisfaction: yes     Patient identity confirmed:  Verbally with patient Location:    Type:   Cyst   Size:  3   Location: face. Pre-procedure details:    Skin preparation:  Povidone-iodine Sedation:    Sedation type:  None Anesthesia:    Anesthesia method:  Local infiltration   Local anesthetic:  Lidocaine  2% WITH epi Procedure type:    Complexity:  Simple Procedure details:    Incision types:  Single straight   Wound management:  Probed and deloculated   Drainage:  Purulent   Drainage amount:  Scant   Wound treatment:  Wound left open   Packing materials:  1/4 in iodoform gauze Post-procedure details:    Procedure completion:  Tolerated well, no immediate complications    Medications Ordered in the ED  lidocaine -EPINEPHrine (XYLOCAINE  W/EPI) 2 %-1:200000 (PF) injection 10 mL (has no administration in time range)                                    Medical Decision Making Risk Prescription drug management.   This patient complains of facial abscess; this involves an extensive number of treatment Options and is a complaint that carries with it a high risk of complications and morbidity. The differential includes abscess, cyst, folliculitis  Previous records obtained and reviewed in epic no recent visits Social determinants considered, no significant barriers Critical Interventions: None  After the interventions stated above, I reevaluated the patient and found patient to be feeling a little better Admission and further testing considered, no indications for admission or further workup at this time.  Will start antibiotics and continue warm compresses.  Wound culture sent.  Return instructions discussed      Final diagnoses:  Facial abscess    ED Discharge Orders          Ordered    doxycycline (VIBRAMYCIN) 100 MG capsule  2 times daily        04/26/24 0734               Lashell Moffitt C, MD 04/26/24 [phone]

## 2024-04-29 LAB — AEROBIC CULTURE W GRAM STAIN (SUPERFICIAL SPECIMEN): Gram Stain: NONE SEEN

## 2024-04-30 ENCOUNTER — Telehealth (HOSPITAL_BASED_OUTPATIENT_CLINIC_OR_DEPARTMENT_OTHER): Payer: Self-pay

## 2024-04-30 NOTE — Telephone Encounter (Signed)
 Post ED Visit - Positive Culture Follow-up  Culture report reviewed by antimicrobial stewardship pharmacist: [facility] [x]  Amon Rocher, Pharm.D. []  Venetia Gully, Pharm.D., BCPS AQ-ID []  Garrel Crews, Pharm.D., BCPS []  Almarie Lunger, Pharm.D., BCPS []  [place], [address].D., BCPS, AAHIVP []  Rosaline Bihari, Pharm.D., BCPS, AAHIVP []  Vernell Meier, PharmD, BCPS []  Latanya Hint, PharmD, BCPS []  Donald Medley, PharmD, BCPS []  Rocky Bold, PharmD []  Dorothyann Alert, PharmD, BCPS []  Morene Babe, PharmD  [facility] []  Rosaline Edison, PharmD []  Romona Bliss, PharmD []  Dolphus Roller, PharmD []  Veva Seip, Rph []  Vernell Daunt) Leonce, PharmD []  Eva Allis, PharmD []  Rosaline Millet, PharmD []  Iantha Batch, PharmD []  Arvin Gauss, PharmD []  Wanda Hasting, PharmD []  Ronal Rav, PharmD []  Rocky Slade, PharmD []  Bard Jeans, PharmD   Positive wound culture Treated with Doxycycline Hyclate, organism sensitive to the same and no further patient follow-up is required at this time.  Douglas Dickson 04/30/2024, 10:33 AM
